# Patient Record
Sex: Male | Born: 1980 | Race: White | Hispanic: No | Marital: Single | Smoking: Never smoker
Health system: Southern US, Community
[De-identification: ages and names within clinical notes are randomized; demographics above are authoritative.]

---

## 2015-01-20 ENCOUNTER — Emergency Department (HOSPITAL_COMMUNITY)
Admission: EM | Admit: 2015-01-20 | Discharge: 2015-01-20 | Disposition: A | Discharge status: Home / Self Care | Payer: Medicaid - Out of State | Attending: Emergency Medicine | Admitting: Emergency Medicine

## 2015-01-20 ENCOUNTER — Encounter (HOSPITAL_COMMUNITY): Payer: Self-pay | Admitting: Emergency Medicine

## 2015-01-20 ENCOUNTER — Emergency Department (HOSPITAL_COMMUNITY): Payer: Medicaid - Out of State

## 2015-01-20 DIAGNOSIS — S060X1A Concussion with loss of consciousness of 30 minutes or less, initial encounter: Secondary | ICD-10-CM | POA: Insufficient documentation

## 2015-01-20 DIAGNOSIS — S0990XA Unspecified injury of head, initial encounter: Secondary | ICD-10-CM

## 2015-01-20 DIAGNOSIS — Y9289 Other specified places as the place of occurrence of the external cause: Secondary | ICD-10-CM | POA: Diagnosis not present

## 2015-01-20 DIAGNOSIS — Y9389 Activity, other specified: Secondary | ICD-10-CM | POA: Diagnosis not present

## 2015-01-20 DIAGNOSIS — W2209XA Striking against other stationary object, initial encounter: Secondary | ICD-10-CM | POA: Insufficient documentation

## 2015-01-20 DIAGNOSIS — Y998 Other external cause status: Secondary | ICD-10-CM | POA: Diagnosis not present

## 2015-01-20 MED ORDER — ONDANSETRON 4 MG PO TBDP: 4.0000 mg | ORAL_TABLET | Freq: Three times a day (TID) | ORAL | Status: AC | PRN

## 2015-01-20 MED ORDER — IBUPROFEN 600 MG PO TABS: 600.0000 mg | ORAL_TABLET | Freq: Four times a day (QID) | ORAL | Status: AC | PRN

## 2015-01-20 NOTE — Discharge Instructions (Signed)
Concussion °A concussion is a brain injury. It is caused by: °· A hit to the head. °· A quick and sudden movement (jolt) of the head or neck. °A concussion is usually not life threatening. Even so, it can cause serious problems. If you had a concussion before, you may have concussion-like problems after a hit to your head. °HOME CARE °General Instructions °· Follow your doctor's directions carefully. °· Take medicines only as told by your doctor. °· Only take medicines your doctor says are safe. °· Do not drink alcohol until your doctor says it is okay. Alcohol and some drugs can slow down healing. They can also put you at risk for further injury. °· If you are having trouble remembering things, write them down. °· Try to do one thing at a time if you get distracted easily. For example, do not watch TV while making dinner. °· Talk to your family members or close friends when making important decisions. °· Follow up with your doctor as told. °· Watch your symptoms. Tell others to do the same. Serious problems can sometimes happen after a concussion. Older adults are more likely to have these problems. °· Tell your teachers, school nurse, school counselor, coach, athletic trainer, or work manager about your concussion. Tell them about what you can or cannot do. They should watch to see if: °¨ It gets even harder for you to pay attention or concentrate. °¨ It gets even harder for you to remember things or learn new things. °¨ You need more time than normal to finish things. °¨ You become annoyed (irritable) more than before. °¨ You are not able to deal with stress as well. °¨ You have more problems than before. °· Rest. Make sure you: °¨ Get plenty of sleep at night. °¨ Go to sleep early. °¨ Go to bed at the same time every day. Try to wake up at the same time. °¨ Rest during the day. °¨ Take naps when you feel tired. °· Limit activities where you have to think a lot or concentrate. These include: °¨ Doing  homework. °¨ Doing work related to a job. °¨ Watching TV. °¨ Using the computer. °Returning To Your Regular Activities °Return to your normal activities slowly, not all at once. You must give your body and brain enough time to heal.  °· Do not play sports or do other athletic activities until your doctor says it is okay. °· Ask your doctor when you can drive, ride a bicycle, or work other vehicles or machines. Never do these things if you feel dizzy. °· Ask your doctor about when you can return to work or school. °Preventing Another Concussion °It is very important to avoid another brain injury, especially before you have healed. In rare cases, another injury can lead to permanent brain damage, brain swelling, or death. The risk of this is greatest during the first 7-10 days after your injury. Avoid injuries by:  °· Wearing a seat belt when riding in a car. °· Not drinking too much alcohol. °· Avoiding activities that could lead to a second concussion (such as contact sports). °· Wearing a helmet when doing activities like: °¨ Biking. °¨ Skiing. °¨ Skateboarding. °¨ Skating. °· Making your home safer by: °¨ Removing things from the floor or stairways that could make you trip. °¨ Using grab bars in bathrooms and handrails by stairs. °¨ Placing non-slip mats on floors and in bathtubs. °¨ Improve lighting in dark areas. °GET HELP IF: °· It   gets even harder for you to pay attention or concentrate.  It gets even harder for you to remember things or learn new things.  You need more time than normal to finish things.  You become annoyed (irritable) more than before.  You are not able to deal with stress as well.  You have more problems than before.  You have problems keeping your balance.  You are not able to react quickly when you should. Get help if you have any of these problems for more than 2 weeks:   Lasting (chronic) headaches.  Dizziness or trouble balancing.  Feeling sick to your stomach  (nausea).  Seeing (vision) problems.  Being affected by noises or light more than normal.  Feeling sad, low, down in the dumps, blue, gloomy, or empty (depressed).  Mood changes (mood swings).  Feeling of fear or nervousness about what may happen (anxiety).  Feeling annoyed.  Memory problems.  Problems concentrating or paying attention.  Sleep problems.  Feeling tired all the time. GET HELP RIGHT AWAY IF:   You have bad headaches or your headaches get worse.  You have weakness (even if it is in one hand, leg, or part of the face).  You have loss of feeling (numbness).  You feel off balance.  You keep throwing up (vomiting).  You feel tired.  One black center of your eye (pupil) is larger than the other.  You twitch or shake violently (convulse).  Your speech is not clear (slurred).  You are more confused, easily angered (agitated), or annoyed than before.  You have more trouble resting than before.  You are unable to recognize people or places.  You have neck pain.  It is difficult to wake you up.  You have unusual behavior changes.  You pass out (lose consciousness). MAKE SURE YOU:   Understand these instructions.  Will watch your condition.  Will get help right away if you are not doing well or get worse. Document Released: 05/31/2009 Document Revised: 10/27/2013 Document Reviewed: 01/02/2013 [facility] Patient Information 2015 [place], [place]. This information is not intended to replace advice given to you by your health care provider. Make sure you discuss any questions you have with your health care provider. Your CT Scan shown no intracranial bleeding

## 2015-01-20 NOTE — ED Notes (Signed)
Pt states that he does not want anything for pain at this time  ?

## 2015-01-20 NOTE — ED Notes (Signed)
Pt. accidentally hit his head against a door frame last Thursday with brief LOC 1 hour after head injury with emesis , reports intermittent headaches this week  , alert and oriented at arrival .

## 2015-01-20 NOTE — ED Notes (Signed)
Pt ambulatory to room.

## 2015-01-20 NOTE — ED Provider Notes (Signed)
CSN: [id number]     Arrival date & time 01/20/15  0219 History   First MD Initiated Contact with Patient 01/20/15 0422     Chief Complaint  Patient presents with  . Headache     (Consider location/radiation/quality/duration/timing/severity/associated sxs/prior Treatment) HPI Comments: This is a normally healthy male.  He states that 3 days ago he stood up and hit the top of his head on a cabinet door.  He did not have immediate loss of consciousness, but passed out approximately one-hour later, he did have one episode of emesis on that day. Since that time he's had intermittent episodes of nausea and intermittent headaches. He has not taken any medication for his headache.  He's been working, but states he is very fatigued and feels "foggy"  The history is provided by the patient.    History reviewed. No pertinent past medical history. History reviewed. No pertinent past surgical history. No family history on file. History  Substance Use Topics  . Smoking status: Never Smoker   . Smokeless tobacco: Not on file  . Alcohol Use: Yes    Review of Systems  Constitutional: Positive for fatigue. Negative for fever.  Eyes: Negative for visual disturbance.  Gastrointestinal: Positive for nausea.  Musculoskeletal: Negative for neck pain.  Skin: Negative for wound.  Neurological: Positive for headaches. Negative for dizziness, syncope, speech difficulty and numbness.  All other systems reviewed and are negative.     Allergies  Keflex  Home Medications   Prior to Admission medications   Medication Sig Start Date End Date Taking? Authorizing Provider  ibuprofen (ADVIL,MOTRIN) 600 MG tablet Take 1 tablet (600 mg total) by mouth every 6 (six) hours as needed. 01/20/15   Earley Favor, NP  ondansetron (ZOFRAN ODT) 4 MG disintegrating tablet Take 1 tablet (4 mg total) by mouth every 8 (eight) hours as needed for nausea or vomiting. 01/20/15   Earley Favor, NP   BP 116/67 mmHg  Pulse 49   Temp(Src) 97.9 F (36.6 C) (Oral)  Resp 12  Ht  (1.88 m)  Wt 200 lb (90.719 kg)  BMI 25.67 kg/m2  SpO2 99% Physical Exam  Constitutional: He is oriented to person, place, and time. He appears well-developed and well-nourished.  HENT:  Head: Normocephalic.  Right Ear: External ear normal.  Left Ear: External ear normal.  Mouth/Throat: Oropharynx is clear and moist.  Eyes: EOM are normal. Pupils are equal, round, and reactive to light.  Neck: Normal range of motion. No spinous process tenderness and no muscular tenderness present. Normal range of motion present.  Cardiovascular: Normal rate.   Pulmonary/Chest: Effort normal.  Musculoskeletal: Normal range of motion.  Neurological: He is alert and oriented to person, place, and time.  Skin: Skin is warm and dry. No pallor.  Nursing note and vitals reviewed.   ED Course  Procedures (including critical care time) Labs Review Labs Reviewed - No data to display  Imaging Review Ct Head Wo Contrast  01/20/2015   CLINICAL DATA:  Head injury 5 days prior, hit head against a door frame with brief loss of consciousness. Intermittent headaches since that time.  EXAM: CT HEAD WITHOUT CONTRAST  TECHNIQUE: Contiguous axial images were obtained from the base of the skull through the vertex without intravenous contrast.  COMPARISON:  None.  FINDINGS: No intracranial hemorrhage, mass effect, or midline shift. No hydrocephalus. The basilar cisterns are patent. No evidence of territorial infarct. No intracranial fluid collection. Calvarium is intact. There is diffuse mucosal thickening  and opacification of the ethmoid air cells, maxillary sinuses, and sphenoid sinuses. Mild mucosal thickening in the right frontal sinus. Mastoid air cells are well aerated.  IMPRESSION: 1.  No acute intracranial abnormality. 2. Diffuse mucosal thickening and opacification of the maxillary sinuses, sphenoid sinus, and ethmoid air cells. Correlation for sinusitis symptoms  recommended.   Electronically Signed   By: Rubye Oaks M.D.   On: 01/20/2015 05:51     EKG Interpretation None     patient was offered both pain control and antiemetics.  He declined both at this time.  I will obtain a CT of his head.  MDM   Final diagnoses:  Head injury  Concussion, with loss of consciousness of 30 minutes or less, initial encounter         Earley Favor, NP 01/20/15 1610  Layla Maw Ward, DO 01/20/15 [id number]

## 2017-02-02 IMAGING — CT CT HEAD W/O CM
1 of 2 series · 15 of 30 positions shown, 19 images · non-contrast
Comparison: None.

CLINICAL DATA: Head injury 5 days prior, hit head against a door
frame with brief loss of consciousness. Intermittent headaches since
that time.

EXAM:
CT HEAD WITHOUT CONTRAST
TECHNIQUE: Contiguous axial images were obtained from the base of the skull
through the vertex without intravenous contrast.

[Series 2: head 2.0 h70h · axial · 0.47mm/px · z∈[-148,-4]mm · 15 of 82 slices shown, 19 images]
[im 5/82  brain]
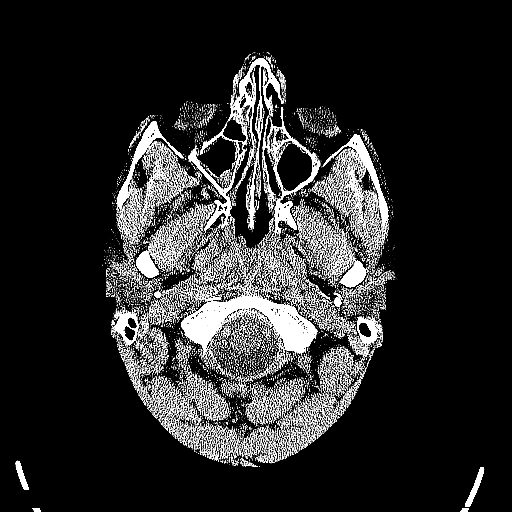
[im 5/82  bone]
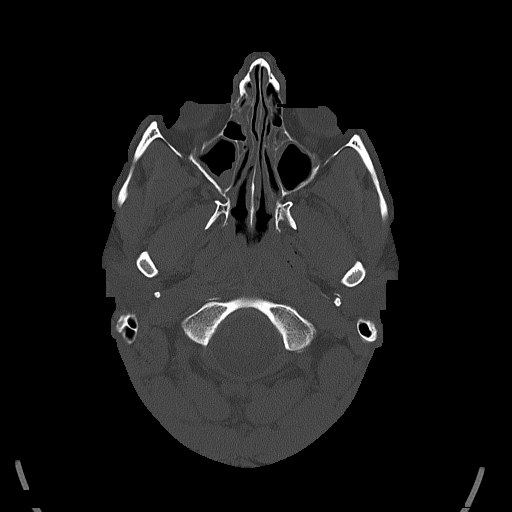
[im 9/82  brain]
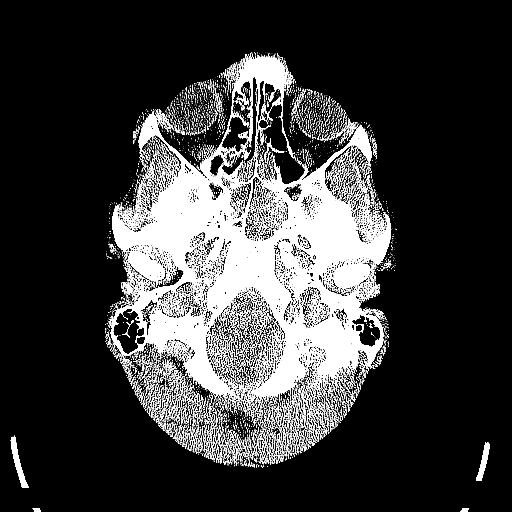
[im 17/82  brain]
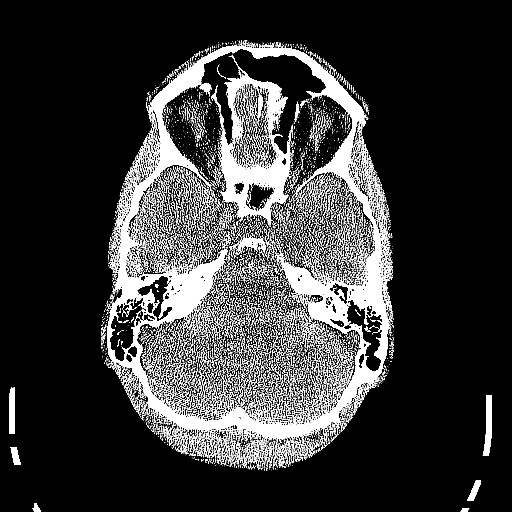
[im 21/82  brain]
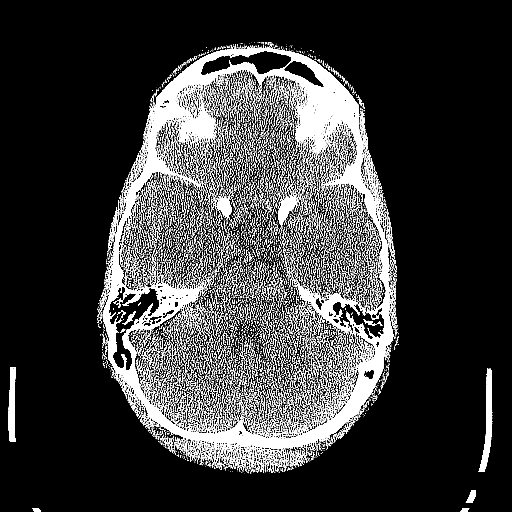
[im 25/82  brain]
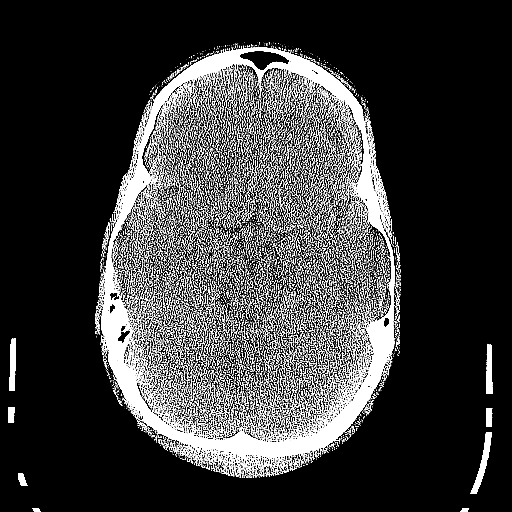
[im 25/82  bone]
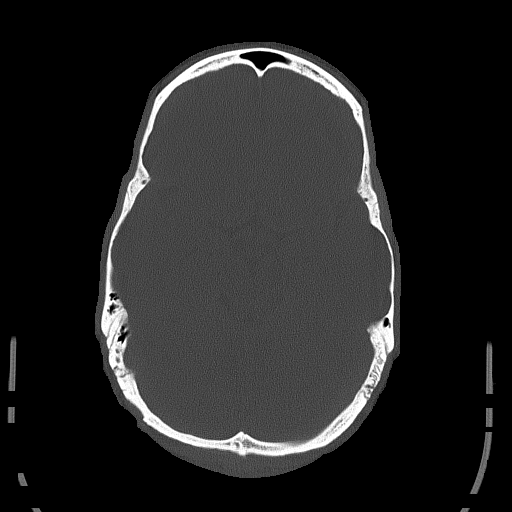
[im 29/82  brain]
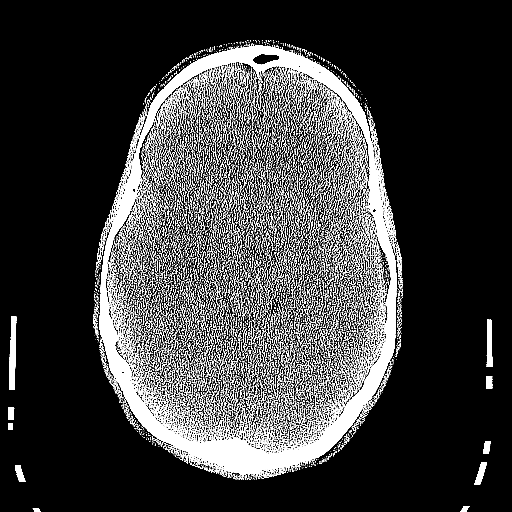
[im 37/82  brain]
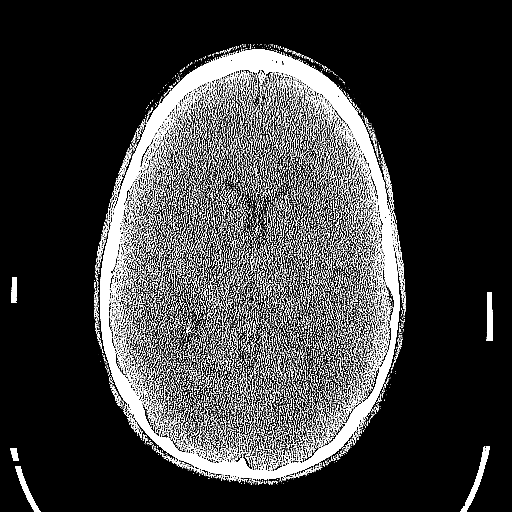
[im 41/82  brain]
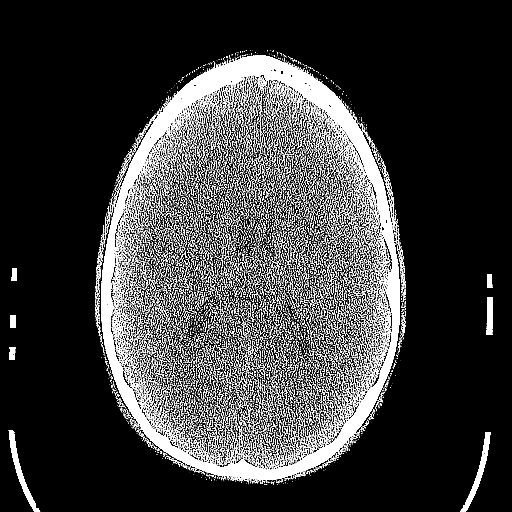
[im 45/82  brain]
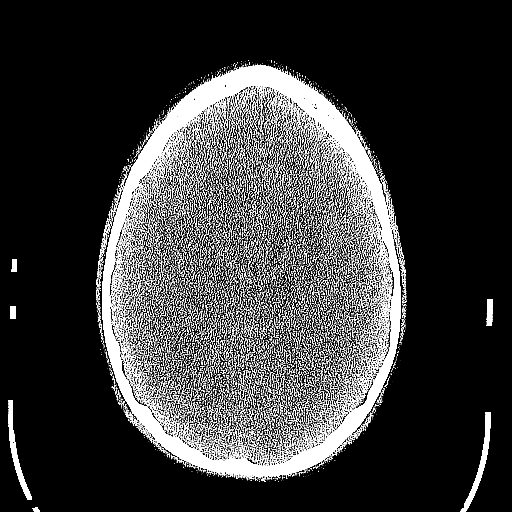
[im 45/82  bone]
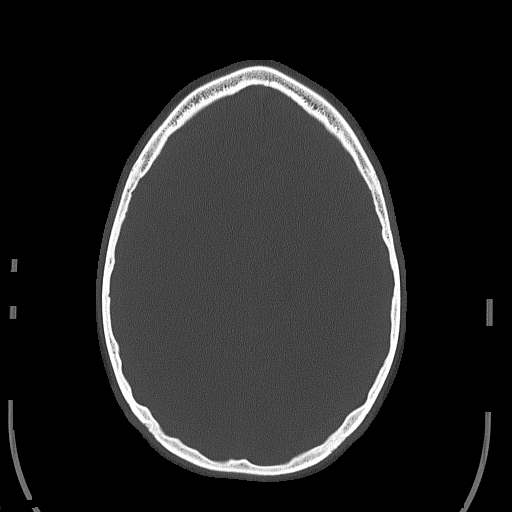
[im 53/82  brain]
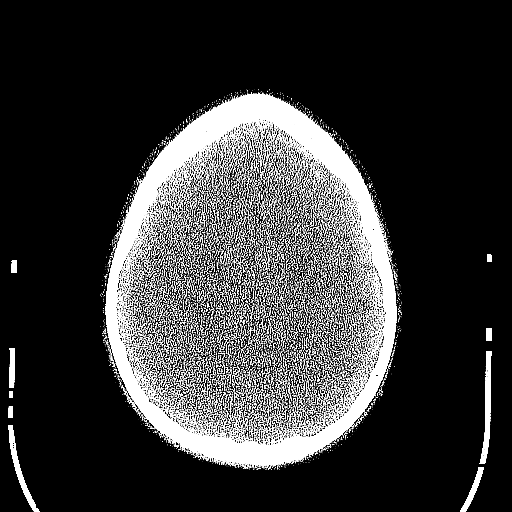
[im 57/82  brain]
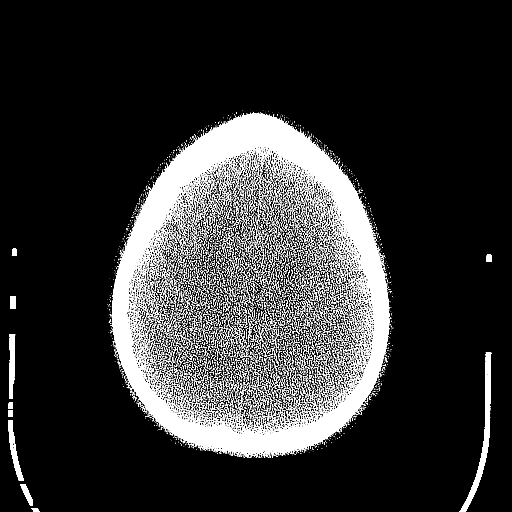
[im 61/82  brain]
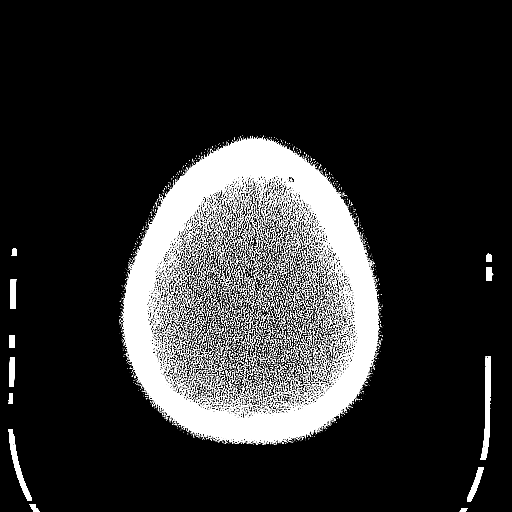
[im 65/82  brain]
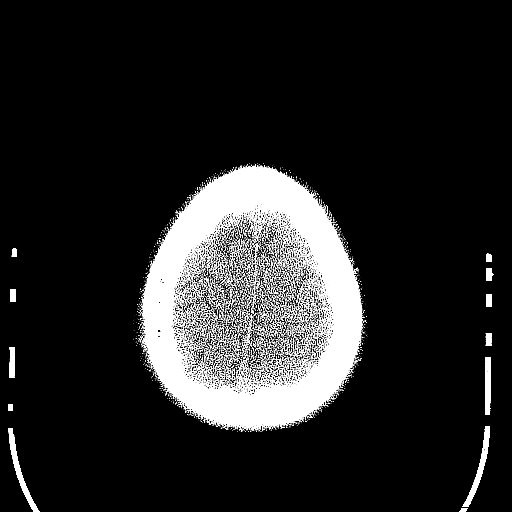
[im 65/82  bone]
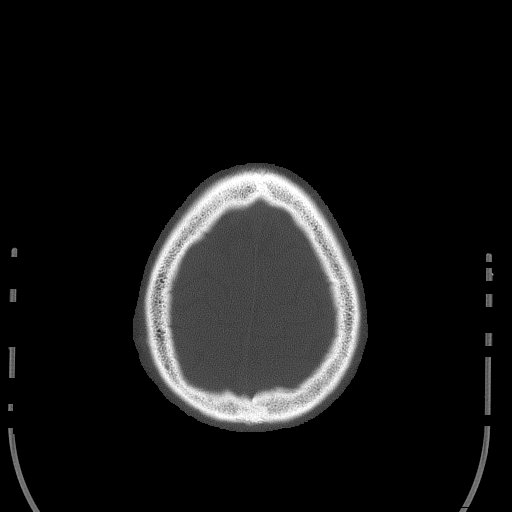
[im 73/82  brain]
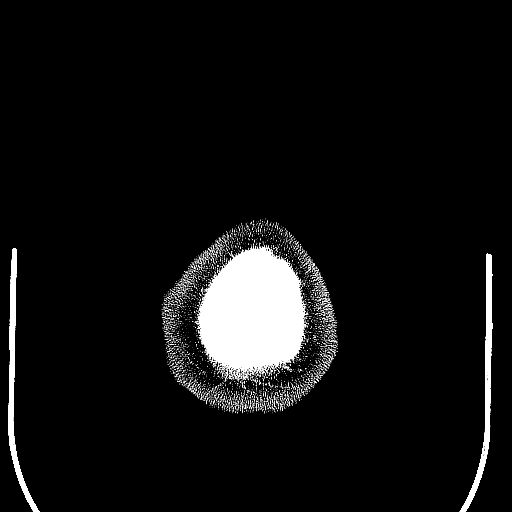
[im 77/82  brain]
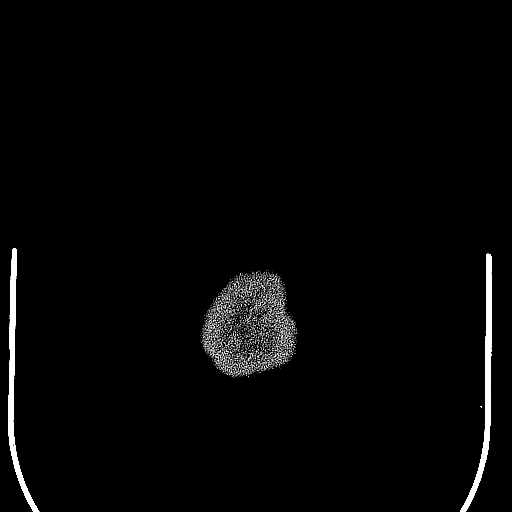

[15 of 30 positions shown; findings below may reference images not displayed]

FINDINGS: No intracranial hemorrhage, mass effect, or midline shift. No
hydrocephalus. The basilar cisterns are patent. No evidence of
territorial infarct. No intracranial fluid collection. Calvarium is
intact. There is diffuse mucosal thickening and opacification of the
ethmoid air cells, maxillary sinuses, and sphenoid sinuses. Mild
mucosal thickening in the right frontal sinus. Mastoid air cells are
well aerated.
IMPRESSION: 1.  No acute intracranial abnormality.
2. Diffuse mucosal thickening and opacification of the maxillary
sinuses, sphenoid sinus, and ethmoid air cells. Correlation for
sinusitis symptoms recommended.
